# Patient Record
Sex: Female | Born: 1995 | Race: White | Hispanic: No | Marital: Single | Smoking: Never smoker
Health system: Southern US, Community
[De-identification: ages and names within clinical notes are randomized; demographics above are authoritative.]

---

## 2015-08-09 ENCOUNTER — Other Ambulatory Visit: Payer: Self-pay | Admitting: Gastroenterology

## 2015-08-09 DIAGNOSIS — R1084 Generalized abdominal pain: Secondary | ICD-10-CM

## 2015-08-09 DIAGNOSIS — R6881 Early satiety: Secondary | ICD-10-CM

## 2015-08-09 DIAGNOSIS — R1013 Epigastric pain: Secondary | ICD-10-CM

## 2015-08-09 DIAGNOSIS — R634 Abnormal weight loss: Secondary | ICD-10-CM

## 2015-08-17 ENCOUNTER — Encounter
Admission: RE | Admit: 2015-08-17 | Discharge: 2015-08-17 | Disposition: A | Discharge status: Home / Self Care | Payer: PRIVATE HEALTH INSURANCE | Source: Ambulatory Visit | Attending: Gastroenterology | Admitting: Gastroenterology

## 2015-08-17 DIAGNOSIS — R634 Abnormal weight loss: Secondary | ICD-10-CM | POA: Insufficient documentation

## 2015-08-17 DIAGNOSIS — R6881 Early satiety: Secondary | ICD-10-CM | POA: Diagnosis not present

## 2015-08-17 DIAGNOSIS — R1013 Epigastric pain: Secondary | ICD-10-CM | POA: Diagnosis present

## 2015-08-17 DIAGNOSIS — R1084 Generalized abdominal pain: Secondary | ICD-10-CM | POA: Diagnosis present

## 2015-08-17 MED ORDER — TECHNETIUM TC 99M SULFUR COLLOID
2.0000 | Freq: Once | INTRAVENOUS | Status: DC | PRN
  Administered 2015-08-17: 2.17 via INTRAVENOUS
  Filled 2015-08-17: qty 2

## 2016-09-02 ENCOUNTER — Emergency Department: Payer: PRIVATE HEALTH INSURANCE

## 2016-09-02 ENCOUNTER — Emergency Department
Admission: EM | Admit: 2016-09-02 | Discharge: 2016-09-02 | Disposition: A | Discharge status: Home / Self Care | Payer: PRIVATE HEALTH INSURANCE | Attending: Emergency Medicine | Admitting: Emergency Medicine

## 2016-09-02 ENCOUNTER — Encounter: Payer: Self-pay | Admitting: *Deleted

## 2016-09-02 DIAGNOSIS — R1030 Lower abdominal pain, unspecified: Secondary | ICD-10-CM | POA: Diagnosis not present

## 2016-09-02 LAB — COMPREHENSIVE METABOLIC PANEL
ALT: 12 U/L — ABNORMAL LOW (ref 14–54)
AST: 17 U/L (ref 15–41)
Albumin: 4.3 g/dL (ref 3.5–5.0)
Alkaline Phosphatase: 48 U/L (ref 38–126)
Anion gap: 8 (ref 5–15)
BUN: 11 mg/dL (ref 6–20)
CHLORIDE: 106 mmol/L (ref 101–111)
CO2: 25 mmol/L (ref 22–32)
CREATININE: 0.74 mg/dL (ref 0.44–1.00)
Calcium: 9.4 mg/dL (ref 8.9–10.3)
GFR calc Af Amer: >60 mL/min (ref >60)
Glucose, Bld: 135 mg/dL — ABNORMAL HIGH (ref 65–99)
Potassium: 3.4 mmol/L — ABNORMAL LOW (ref 3.5–5.1)
SODIUM: 139 mmol/L (ref 135–145)
Total Bilirubin: 0.6 mg/dL (ref 0.3–1.2)
Total Protein: 7.4 g/dL (ref 6.5–8.1)

## 2016-09-02 LAB — URINALYSIS COMPLETE WITH MICROSCOPIC (ARMC ONLY)
Bilirubin Urine: NEGATIVE
Glucose, UA: NEGATIVE mg/dL
Hgb urine dipstick: NEGATIVE
Ketones, ur: NEGATIVE mg/dL
Nitrite: NEGATIVE
PROTEIN: NEGATIVE mg/dL
SPECIFIC GRAVITY, URINE: 1.006 (ref 1.005–1.030)
pH: 6 (ref 5.0–8.0)

## 2016-09-02 LAB — CBC
HCT: 38.7 % (ref 35.0–47.0)
HEMOGLOBIN: 12.9 g/dL (ref 12.0–16.0)
MCH: 28.4 pg (ref 26.0–34.0)
MCHC: 33.4 g/dL (ref 32.0–36.0)
MCV: 85.1 fL (ref 80.0–100.0)
Platelets: 271 10*3/uL (ref 150–440)
RBC: 4.55 MIL/uL (ref 3.80–5.20)
RDW: 13.5 % (ref 11.5–14.5)
WBC: 16.9 10*3/uL — ABNORMAL HIGH (ref 3.6–11.0)

## 2016-09-02 LAB — LIPASE, BLOOD: LIPASE: 25 U/L (ref 11–51)

## 2016-09-02 LAB — POCT PREGNANCY, URINE: PREG TEST UR: NEGATIVE

## 2016-09-02 MED ORDER — IOPAMIDOL (ISOVUE-300) INJECTION 61%
30.0000 mL | Freq: Once | INTRAVENOUS | Status: AC | PRN
  Administered 2016-09-02: 30 mL via ORAL
  Filled 2016-09-02: qty 30

## 2016-09-02 MED ORDER — IOPAMIDOL (ISOVUE-300) INJECTION 61%
100.0000 mL | Freq: Once | INTRAVENOUS | Status: AC | PRN
  Administered 2016-09-02: 100 mL via INTRAVENOUS
  Filled 2016-09-02: qty 100

## 2016-09-02 MED ORDER — DICYCLOMINE HCL 20 MG PO TABS: 20.0000 mg | ORAL_TABLET | Freq: Three times a day (TID) | ORAL | 0 refills | Status: AC | PRN

## 2016-09-02 NOTE — ED Triage Notes (Signed)
Pt reports abd pain with diarrhea x 4 yesterday.   No vomiting.  Pt took tums without relief.

## 2016-09-02 NOTE — Discharge Instructions (Signed)
Please return immediately if condition worsens. Please contact her primary physician or the physician you were given for referral. If you have any specialist physicians involved in her treatment and plan please also contact them. Thank you for using [facility].  Please return especially for bloody urine, blood in the stool, persistent vomiting, increased pain especially on the right lower quadrant, or any other new concerns. He can take over-the-counter Tylenol and/or Motrin for pain

## 2016-09-02 NOTE — ED Provider Notes (Signed)
Time Seen: Approximately *1651  I have reviewed the triage notes  Chief Complaint: Abdominal Pain and Diarrhea   History of Present Illness: Brooke Montoya is a 20 y.o. female *who presents with loose watery stool that started yesterday with no nausea or vomiting. She states she's had diffuse abdominal pain getting progressively worse starting primarily in the upper abdominal region but now radiates throughout the abdomen but is more intense over the lower abdominal region. She denies any nausea, vomiting. She denies any fever. She denies any vaginal discharge or bleeding. Her last menstrual period was 2 weeks ago and it was normal. She denies any back or flank discomfort. Patient states her abdominal pain is exacerbated by movement and seemed to get worse when lying down in the stretcher.  No past medical history on file.  There are no active problems to display for this patient.   No past surgical history on file.  No past surgical history on file.    Allergies:  Review of patient's allergies indicates no known allergies.  Family History: No family history on file.  Social History: Social History  Substance Use Topics  . Smoking status: Never Smoker  . Smokeless tobacco: Never Used  . Alcohol use No     Review of Systems:   10 point review of systems was performed and was otherwise negative:  Constitutional: No fever Eyes: No visual disturbances ENT: No sore throat, ear pain Cardiac: No chest pain Respiratory: No shortness of breath, wheezing, or stridor Abdomen:Bilateral lower abdominal pain currently Endocrine: No weight loss, No night sweats Extremities: No peripheral edema, cyanosis Skin: No rashes, easy bruising Neurologic: No focal weakness, trouble with speech or swollowing Urologic: No dysuria, Hematuria, or urinary frequency   Physical Exam:  ED Triage Vitals [09/02/16 1509]  Enc Vitals Group     BP 123/69     Pulse Rate (!) 110     Resp       Temp 98.2 F (36.8 C)     Temp Source Oral     SpO2 100 %     Weight 125 lb (56.7 kg)     Height 5\' 9"  (1.753 m)     Head Circumference      Peak Flow      Pain Score 6     Pain Loc      Pain Edu?      Excl. in GC?     General: Awake , Alert , and Oriented times 3; GCS 15 Head: Normal cephalic , atraumatic Eyes: Pupils equal , round, reactive to light Nose/Throat: No nasal drainage, patent upper airway without erythema or exudate.  Neck: Supple, Full range of motion, No anterior adenopathy or palpable thyroid masses Lungs: Clear to ascultation without wheezes , rhonchi, or rales Heart: Regular rate, regular rhythm without murmurs , gallops , or rubs Abdomen: Tender to deep palpation in both lower abdominal regions more toward the suprapubic area. No focal tenderness over McBurney point. Negative Murphy's sign. Negative psoas, negative obturator sign        Extremities: 2 plus symmetric pulses. No edema, clubbing or cyanosis Neurologic: normal ambulation, Motor symmetric without deficits, sensory intact Skin: warm, dry, no rashes   Labs:   All laboratory work was reviewed including any pertinent negatives or positives listed below:  Labs Reviewed  COMPREHENSIVE METABOLIC PANEL - Abnormal; Notable for the following:       Result Value   Potassium 3.4 (*)    Glucose, Bld 135 (*)  ALT 12 (*)    All other components within normal limits  CBC - Abnormal; Notable for the following:    WBC 16.9 (*)    All other components within normal limits  URINALYSIS COMPLETEWITH MICROSCOPIC ([facility] ONLY) - Abnormal; Notable for the following:    Color, Urine STRAW (*)    APPearance CLEAR (*)    Leukocytes, UA 2+ (*)    Bacteria, UA RARE (*)    Squamous Epithelial / LPF 6-30 (*)    All other components within normal limits  LIPASE, BLOOD  POC URINE PREG, ED  POCT PREGNANCY, URINE  Patient's white blood count is elevated. Otherwise no significant laboratory  findings    Radiology: "Ct Abdomen Pelvis W Contrast  Result Date: 09/02/2016 CLINICAL DATA:  Abdominal pain with diarrhea x4 yesterday. EXAM: CT ABDOMEN AND PELVIS WITH CONTRAST TECHNIQUE: Multidetector CT imaging of the abdomen and pelvis was performed using the standard protocol following bolus administration of intravenous contrast. CONTRAST:  100mL ISOVUE-300 IOPAMIDOL (ISOVUE-300) INJECTION 61% COMPARISON:  None. FINDINGS: Lower chest: Clear lung bases. Normal heart size without pericardial or pleural effusion. Hepatobiliary: Focal steatosis adjacent the falciform ligament. Normal gallbladder, without biliary ductal dilatation. Pancreas: Normal, without mass or ductal dilatation. Spleen: Normal in size, without focal abnormality. Adrenals/Urinary Tract: Normal adrenal glands. Normal kidneys, without hydronephrosis. Normal urinary bladder. Stomach/Bowel: Normal stomach, without wall thickening. Normal colon, appendix, and terminal ileum. Normal small bowel. Vascular/Lymphatic: Normal caliber of the aorta and branch vessels. No abdominopelvic adenopathy. Reproductive: Normal uterus. Probable exophytic follicle off the right ovary at 1.6 cm on image 64/series 2. Normal left ovary. Other: No significant free fluid. Musculoskeletal: No acute osseous abnormality. Convex left lumbar spine curvature. IMPRESSION: No acute process in the abdomen or pelvis. Electronically Signed   By: Jeronimo GreavesKyle  Talbot M.D.   On: 09/02/2016 18:23  "  I personally reviewed the radiologic studies    ED Course: Differential diagnosis includes but is not exclusive to ovarian cyst, ovarian torsion, acute appendicitis, urinary tract infection, endometriosis, bowel obstruction, colitis, renal colic, gastroenteritis, etc.  Given the patient's current clinical presentation and objective findings I felt this was unlikely to be a surgical abdomen at this time. Patient had some loose stool yesterday and this may be simple viral  gastroenteritis at this time. She states her diarrhea slowed Was unlikely to be invasive diarrhea. Patient's repeat exam was negative and I felt we could treat her on an outpatient basis.  Clinical Course     Assessment:  Acute unspecified lower abdominal pain in a female      Plan:  Outpatient Over-the-counter pain medication " Discharge Medication List as of 09/02/2016  6:41 PM    START taking these medications   Details  dicyclomine (BENTYL) 20 MG tablet Take 1 tablet (20 mg total) by mouth 3 (three) times daily as needed for spasms., Starting Mon 09/02/2016, Print      " Patient was advised to return immediately if condition worsens. Patient was advised to follow up with their primary care physician or other specialized physicians involved in their outpatient care. The patient and/or family member/power of attorney had laboratory results reviewed at the bedside. All questions and concerns were addressed and appropriate discharge instructions were distributed by the nursing staff.             Jennye MoccasinBrian S Quigley, MD 09/02/16 2042

## 2016-09-02 NOTE — ED Notes (Signed)
Pt alert and oriented X4, active, cooperative, pt in NAD. RR even and unlabored, color WNL.  Pt informed to return if any life threatening symptoms occur.   

## 2017-12-03 IMAGING — CT CT ABD-PELV W/ CM
2 of 4 series · 16 of 46 positions shown, 18 images · IV contrast (APPLIED)
Comparison: None.

CLINICAL DATA: Abdominal pain with diarrhea x4 yesterday.

EXAM:
CT ABDOMEN AND PELVIS WITH CONTRAST
TECHNIQUE: Multidetector CT imaging of the abdomen and pelvis was performed
using the standard protocol following bolus administration of
intravenous contrast.
CONTRAST:  100mL [id number] IOPAMIDOL ([id number]) INJECTION 61%

[Series 2: axial st · axial · 0.65mm/px · z∈[-61,+349]mm · 13 of 90 slices shown, 15 images]
[im 4/90  soft-tissue]
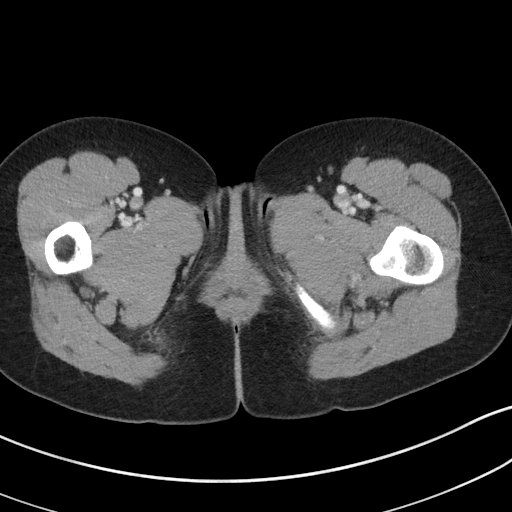
[im 4/90  bone]
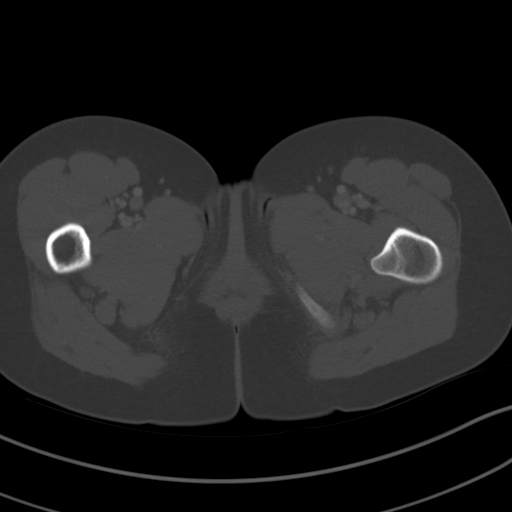
[im 11/90  soft-tissue]
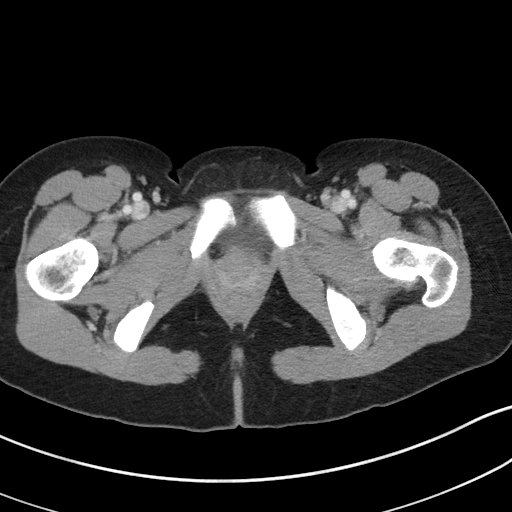
[im 18/90  soft-tissue]
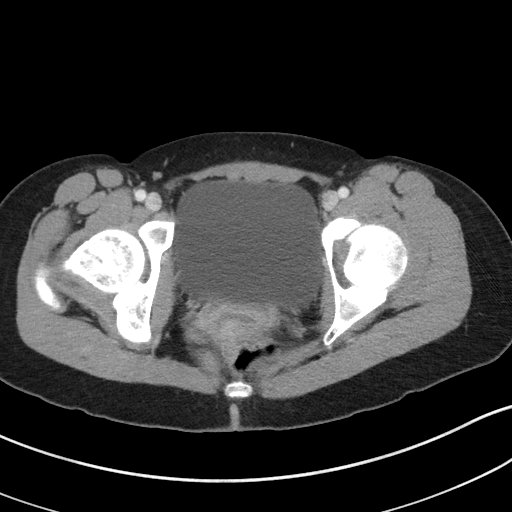
[im 24/90  soft-tissue]
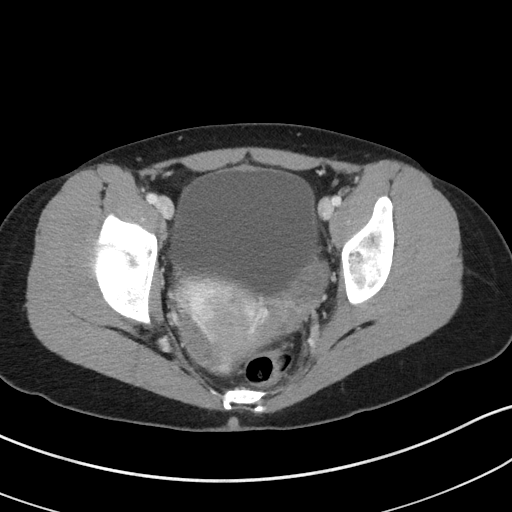
[im 31/90  soft-tissue]
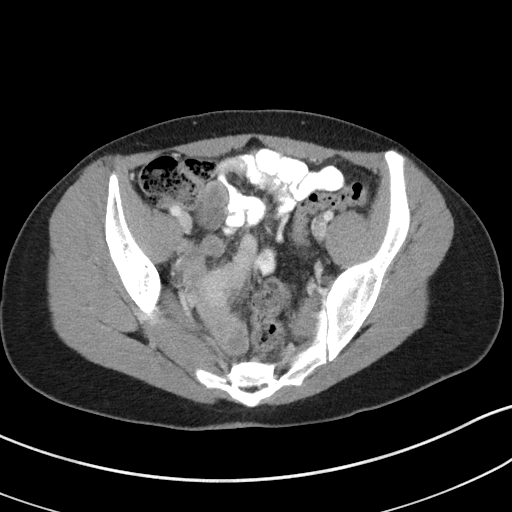
[im 38/90  soft-tissue]
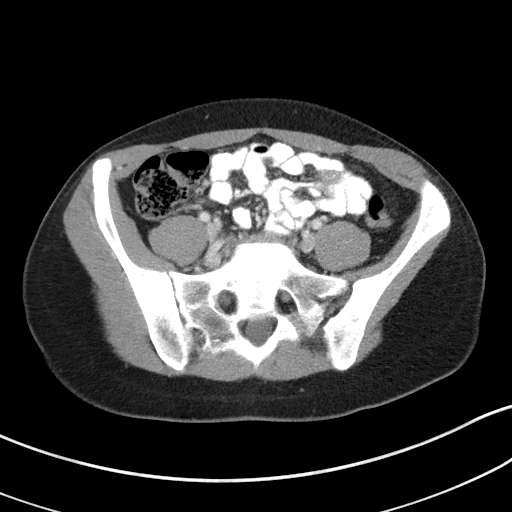
[im 45/90  soft-tissue]
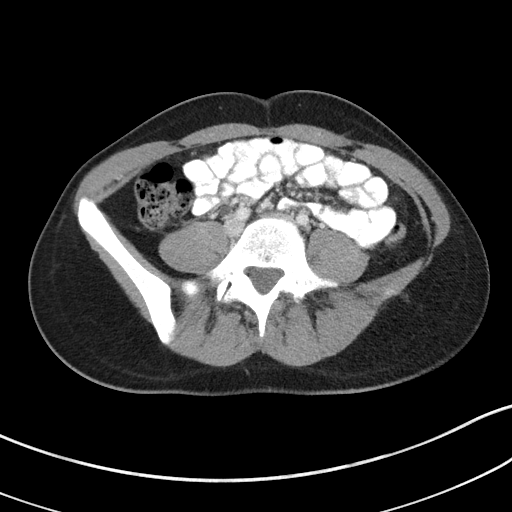
[im 52/90  soft-tissue]
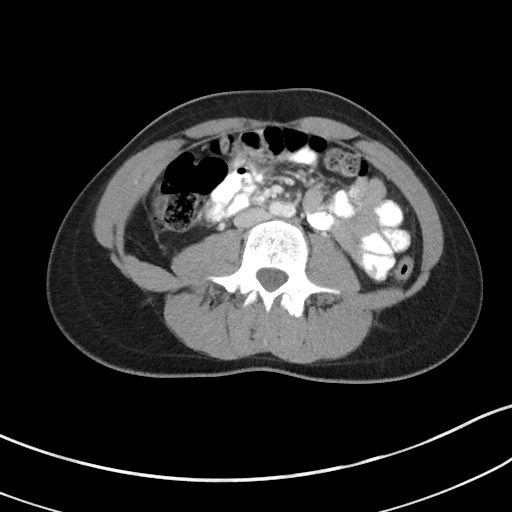
[im 59/90  soft-tissue]
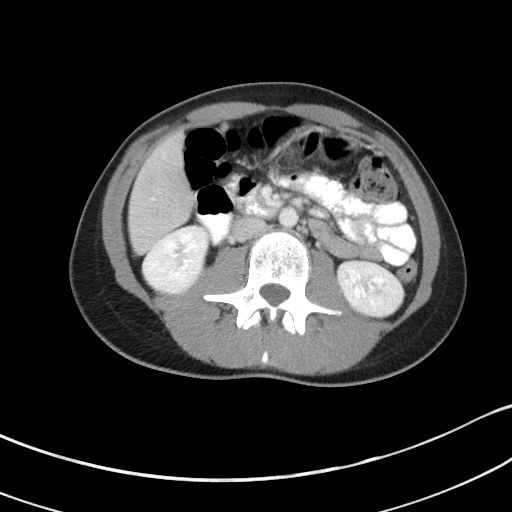
[im 59/90  bone]
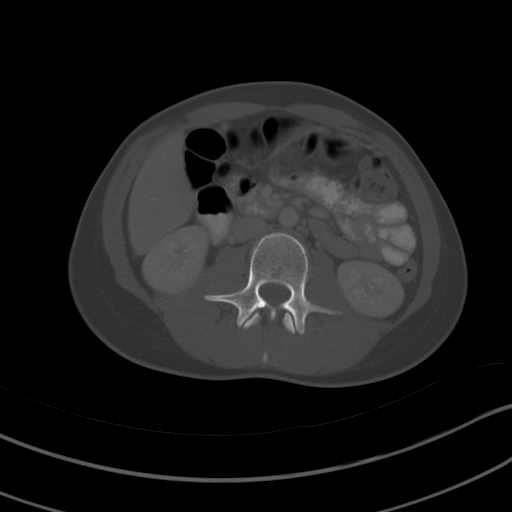
[im 66/90  soft-tissue]
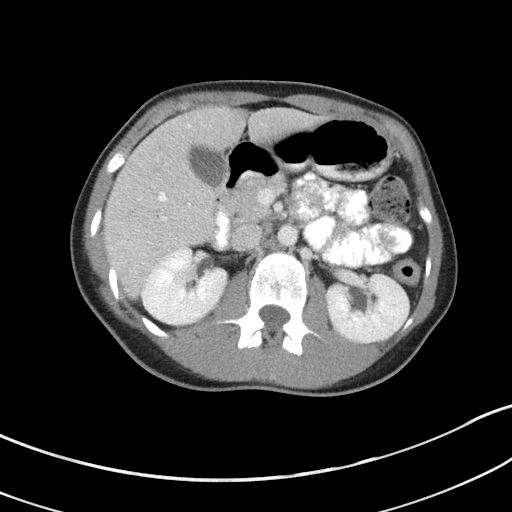
[im 72/90  soft-tissue]
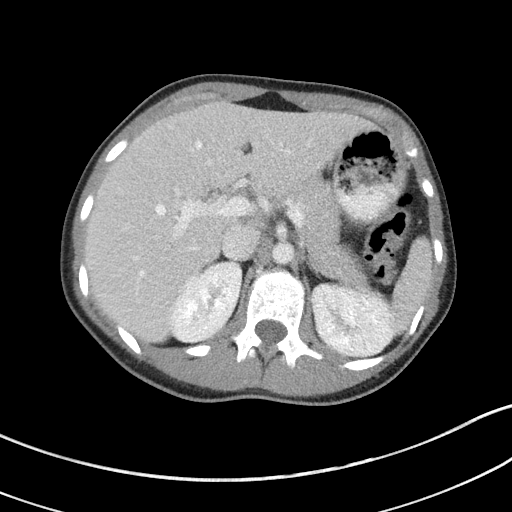
[im 79/90  soft-tissue]
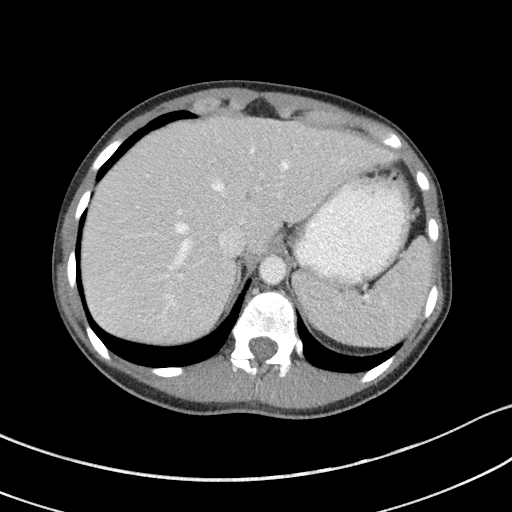
[im 86/90  soft-tissue]
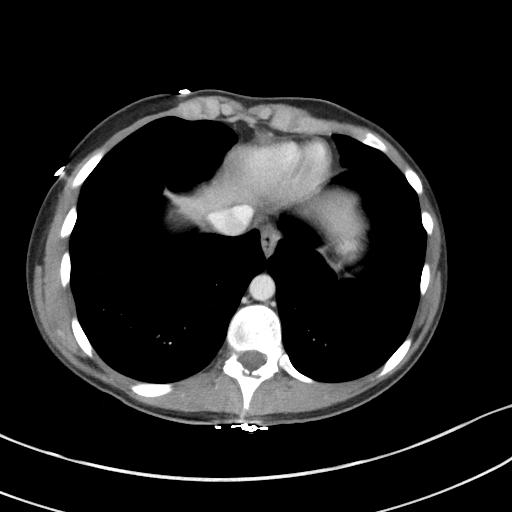

[Series 5: coronal st · coronal · 0.67mm/px · 3 of 76 slices shown]
[im 26/76  soft-tissue]
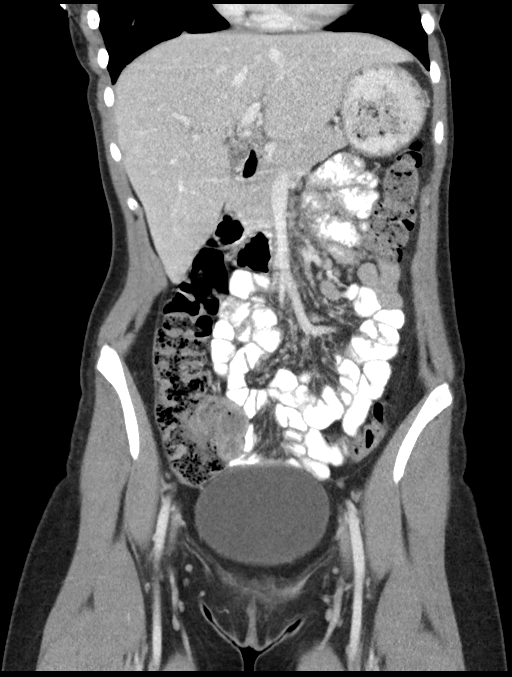
[im 34/76  soft-tissue]
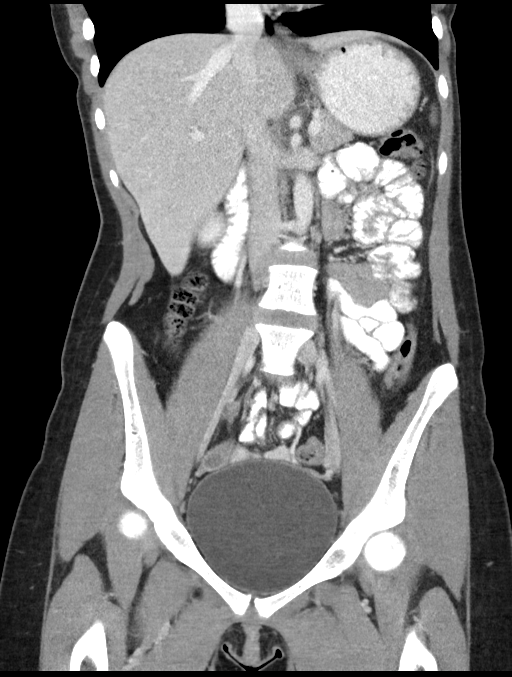
[im 42/76  soft-tissue]
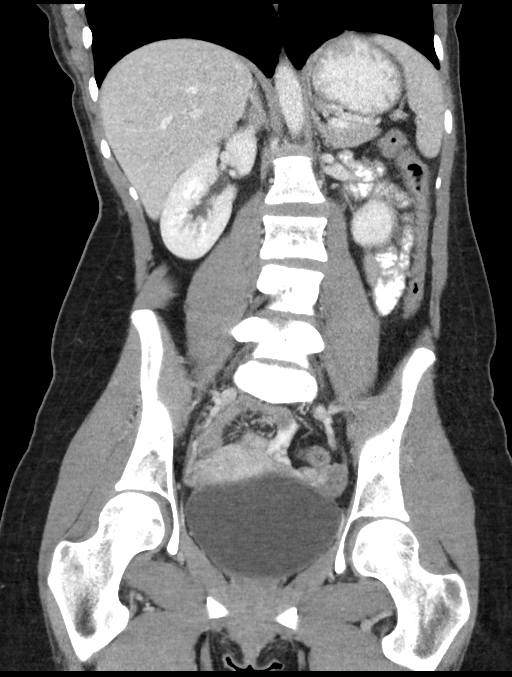

[16 of 46 positions shown; findings below may reference images not displayed]

FINDINGS: Lower chest: Clear lung bases. Normal heart size without pericardial
or pleural effusion.

Hepatobiliary: Focal steatosis adjacent the falciform ligament.
Normal gallbladder, without biliary ductal dilatation.

Pancreas: Normal, without mass or ductal dilatation.

Spleen: Normal in size, without focal abnormality.

Adrenals/Urinary Tract: Normal adrenal glands. Normal kidneys,
without hydronephrosis. Normal urinary bladder.

Stomach/Bowel: Normal stomach, without wall thickening. Normal
colon, appendix, and terminal ileum. Normal small bowel.

Vascular/Lymphatic: Normal caliber of the aorta and branch vessels.
No abdominopelvic adenopathy.

Reproductive: Normal uterus. Probable exophytic follicle off the
right ovary at 1.6 cm on image 64/series 2. Normal left ovary.

Other: No significant free fluid.

Musculoskeletal: No acute osseous abnormality. Convex left lumbar
spine curvature.
IMPRESSION: No acute process in the abdomen or pelvis.
